# Patient Record
Sex: Female | Born: 2000 | Hispanic: Yes | Marital: Single
Health system: Southern US, Community
[De-identification: ages and names within clinical notes are randomized; demographics above are authoritative.]

---
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
## 2022-05-01 IMAGING — DX DG WRIST 2V*L*
2 series · 2 of 2 positions shown · non-contrast
Comparison: Pre splinting radiographs earlier today.

CLINICAL DATA: Fracture, post splint.

EXAM:
LEFT WRIST - 2 VIEW

[wrist ap]
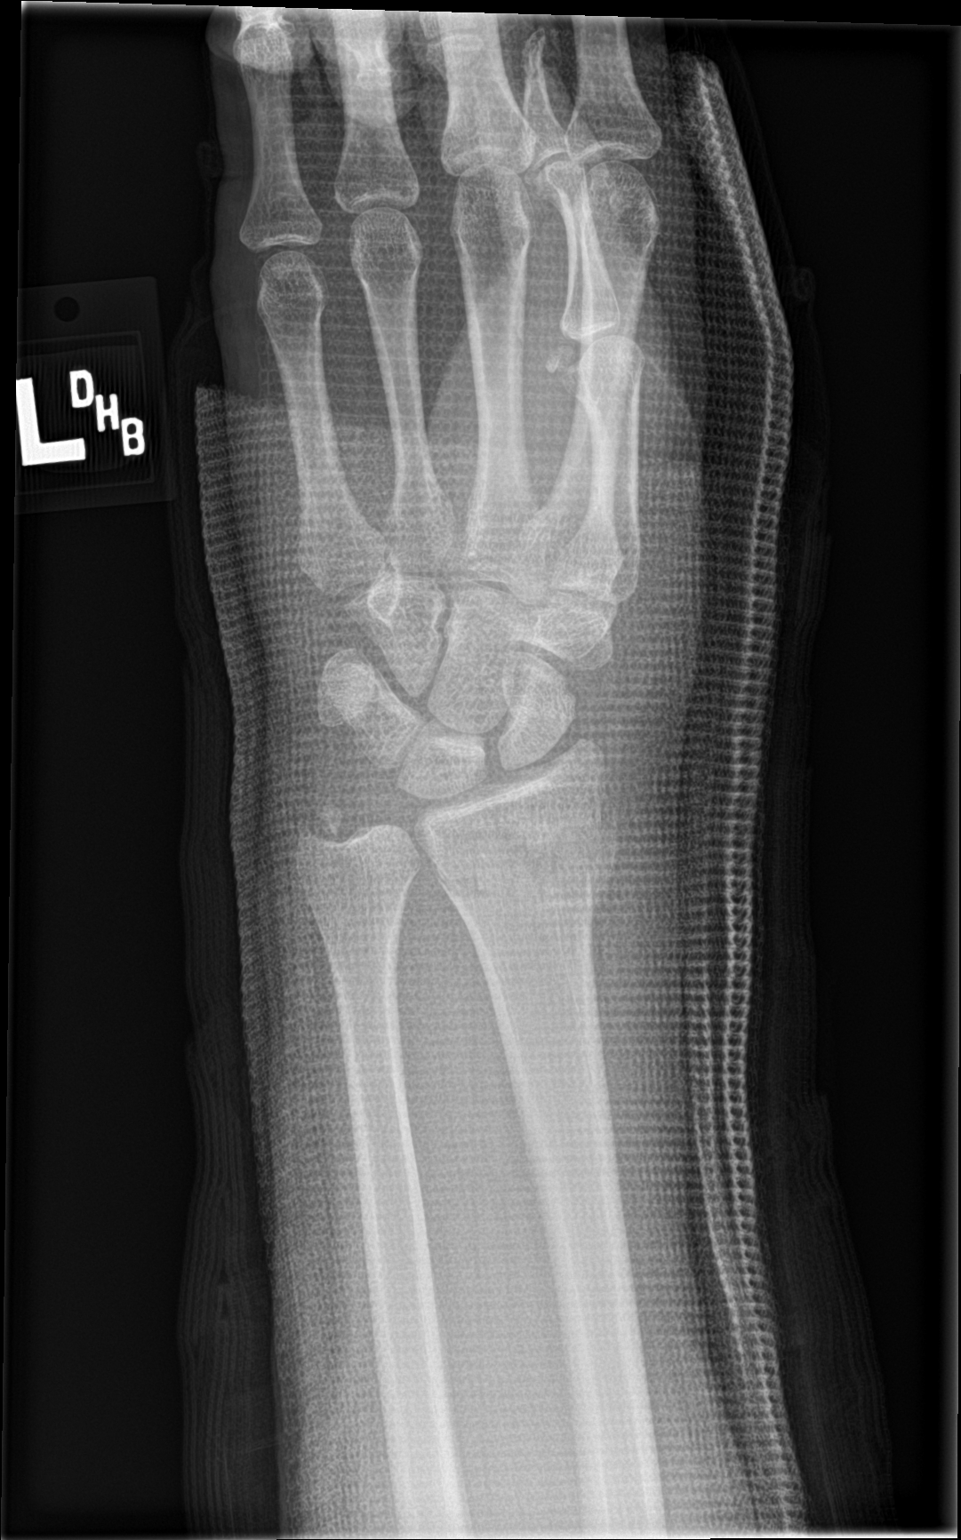

[wrist lat]
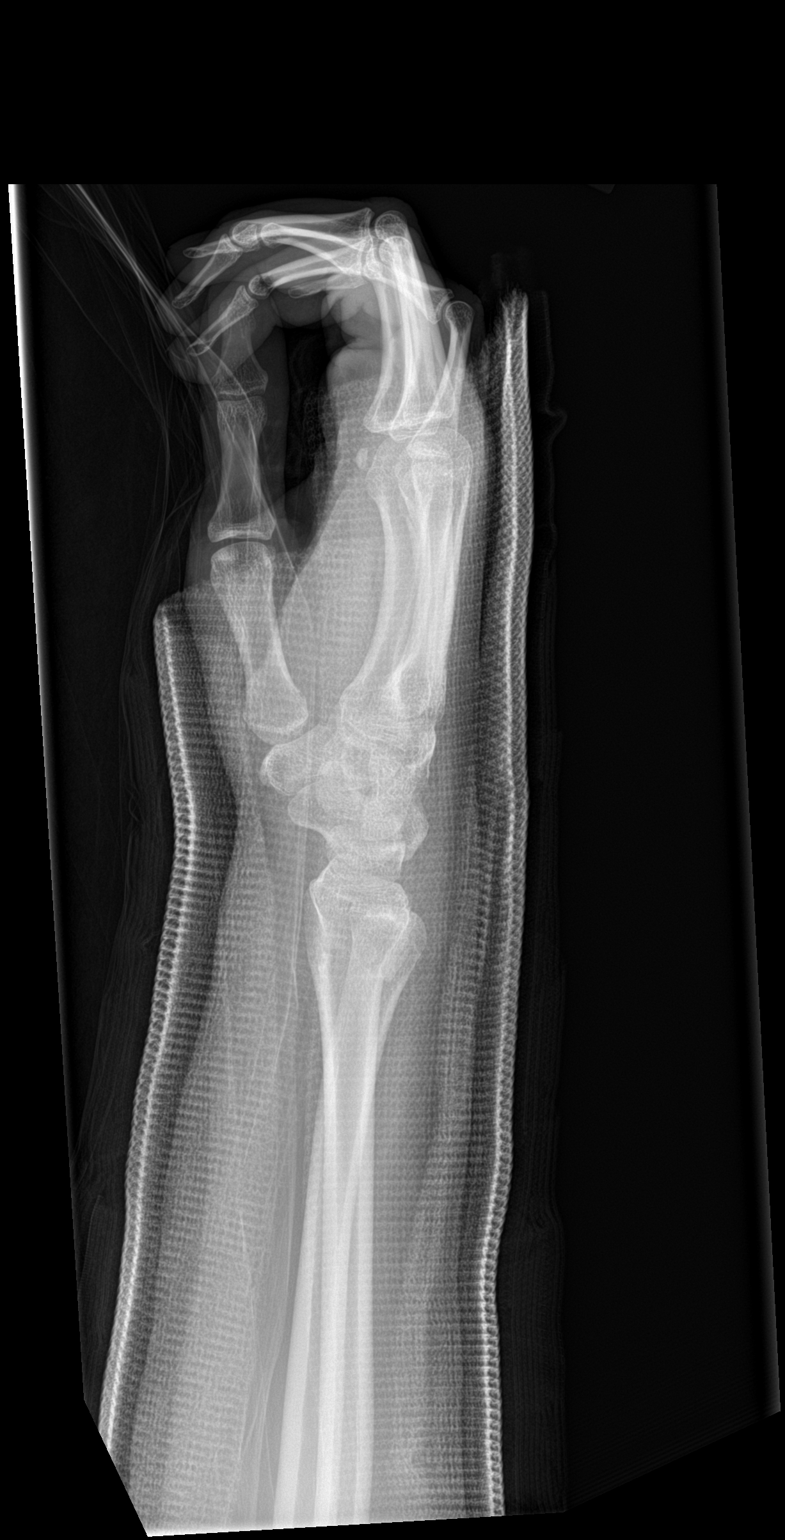

[2 of 2 positions shown; findings below may reference images not displayed]

FINDINGS: Overlying splint material in place. Improved alignment of distal
radius fracture post splinting. Mild residual displacement and
angulation. Unchanged alignment of the ulna styloid fracture. No new
fracture.
IMPRESSION: Improved alignment of distal radius fracture post splinting with
mild residual displacement and angulation. Unchanged ulna styloid
fracture.

## 2022-05-01 IMAGING — DX DG HAND COMPLETE 3+V*L*
2 series · 2 of 2 positions shown · non-contrast
Comparison: None.

CLINICAL DATA: Pain after fall on outstretched hand

EXAM:
LEFT HAND - COMPLETE 3+ VIEW

[hand ap]
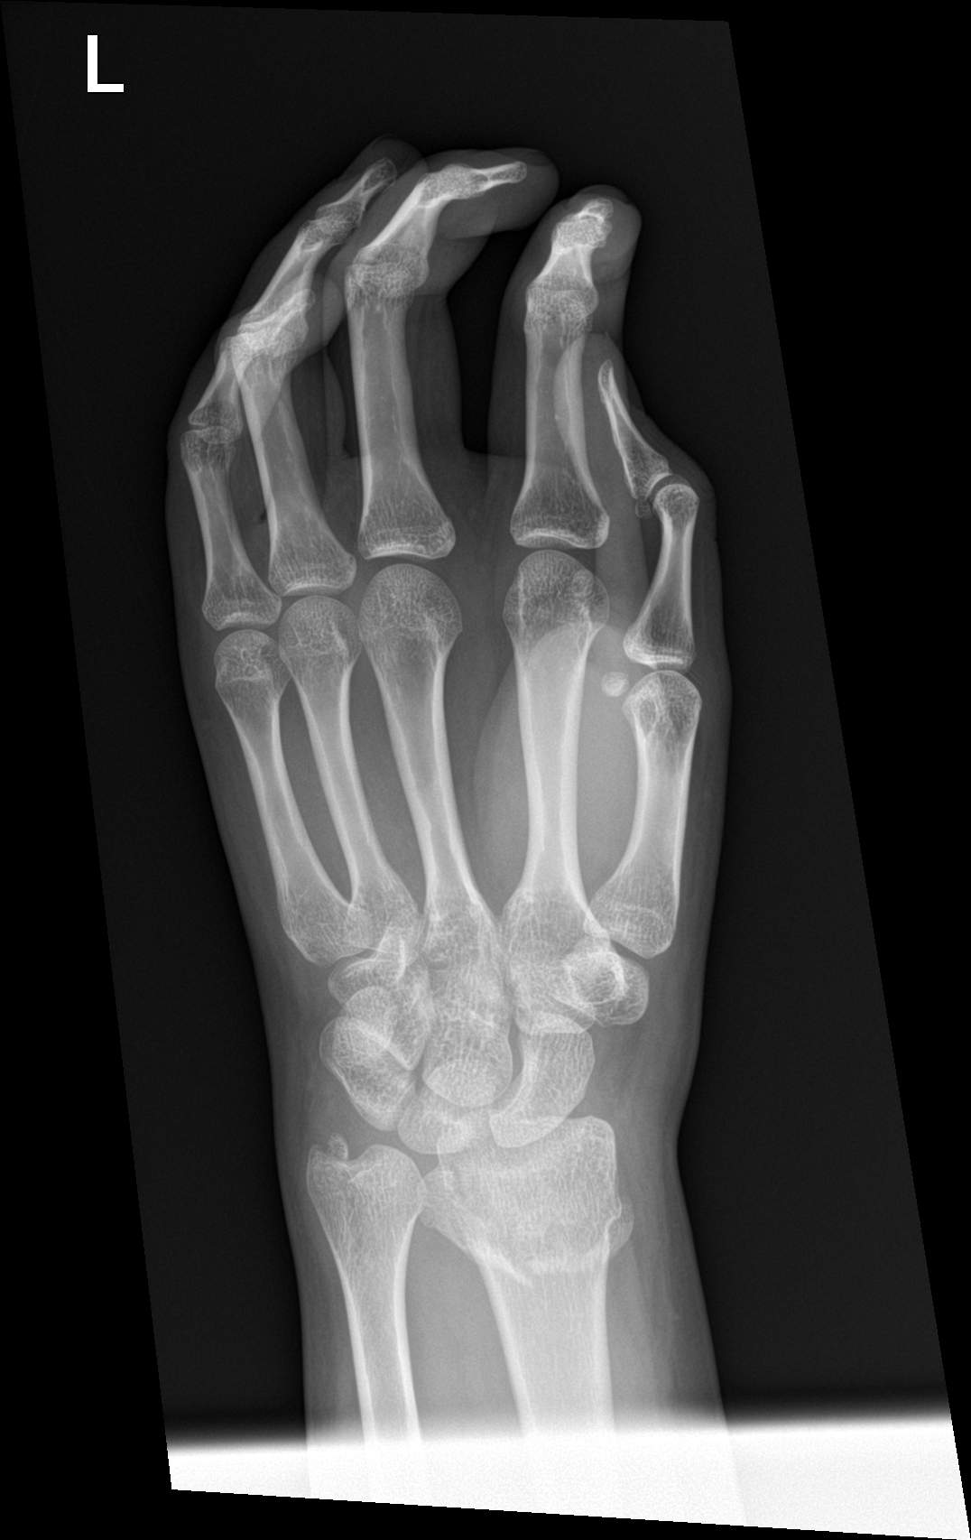

[hand obl]
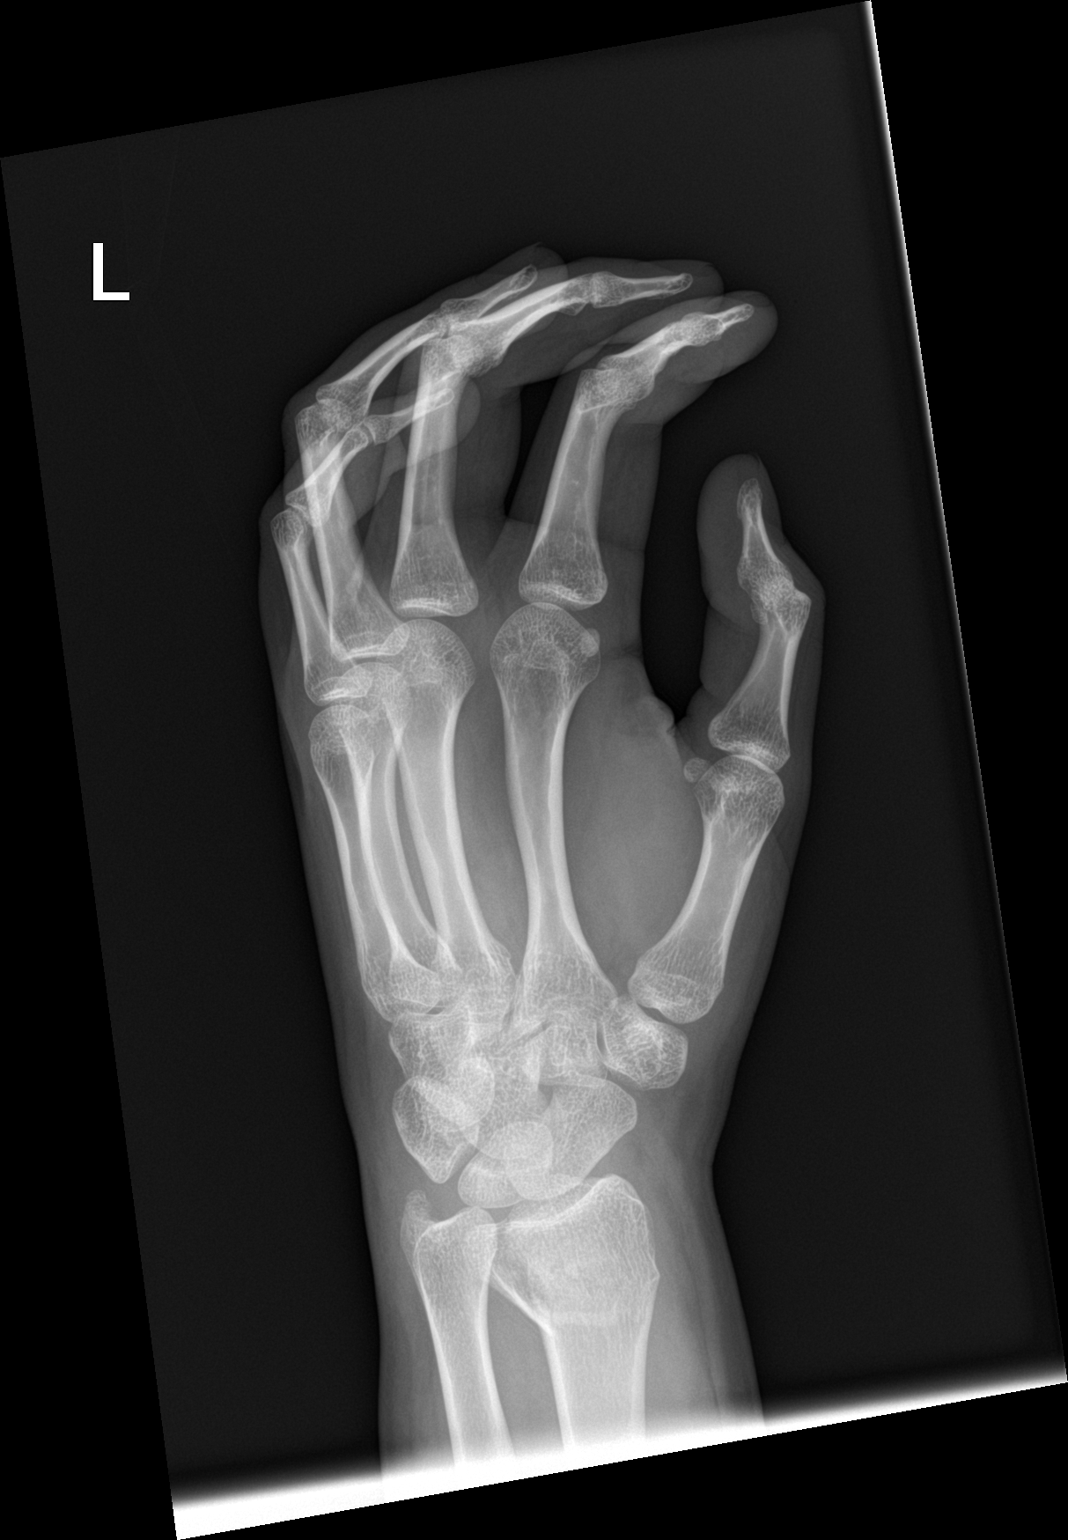

[2 of 2 positions shown; findings below may reference images not displayed]

FINDINGS: Minimally displaced fracture of the ulnar styloid process.
Comminuted impacted fracture of the distal radius with mild dorsal
angulation and extension to the radiocarpal joint.
IMPRESSION: Impacted comminuted distal radius fracture with mild dorsal
angulation and intra-articular extension.

Minimally displaced ulnar styloid fracture.

## 2022-05-01 IMAGING — DX DG WRIST COMPLETE 3+V*L*
3 series · 3 of 3 positions shown · non-contrast
Comparison: None.

CLINICAL DATA: Pain after fall on outstretched hand.

EXAM:
LEFT WRIST - COMPLETE 3+ VIEW

[wrist ap]
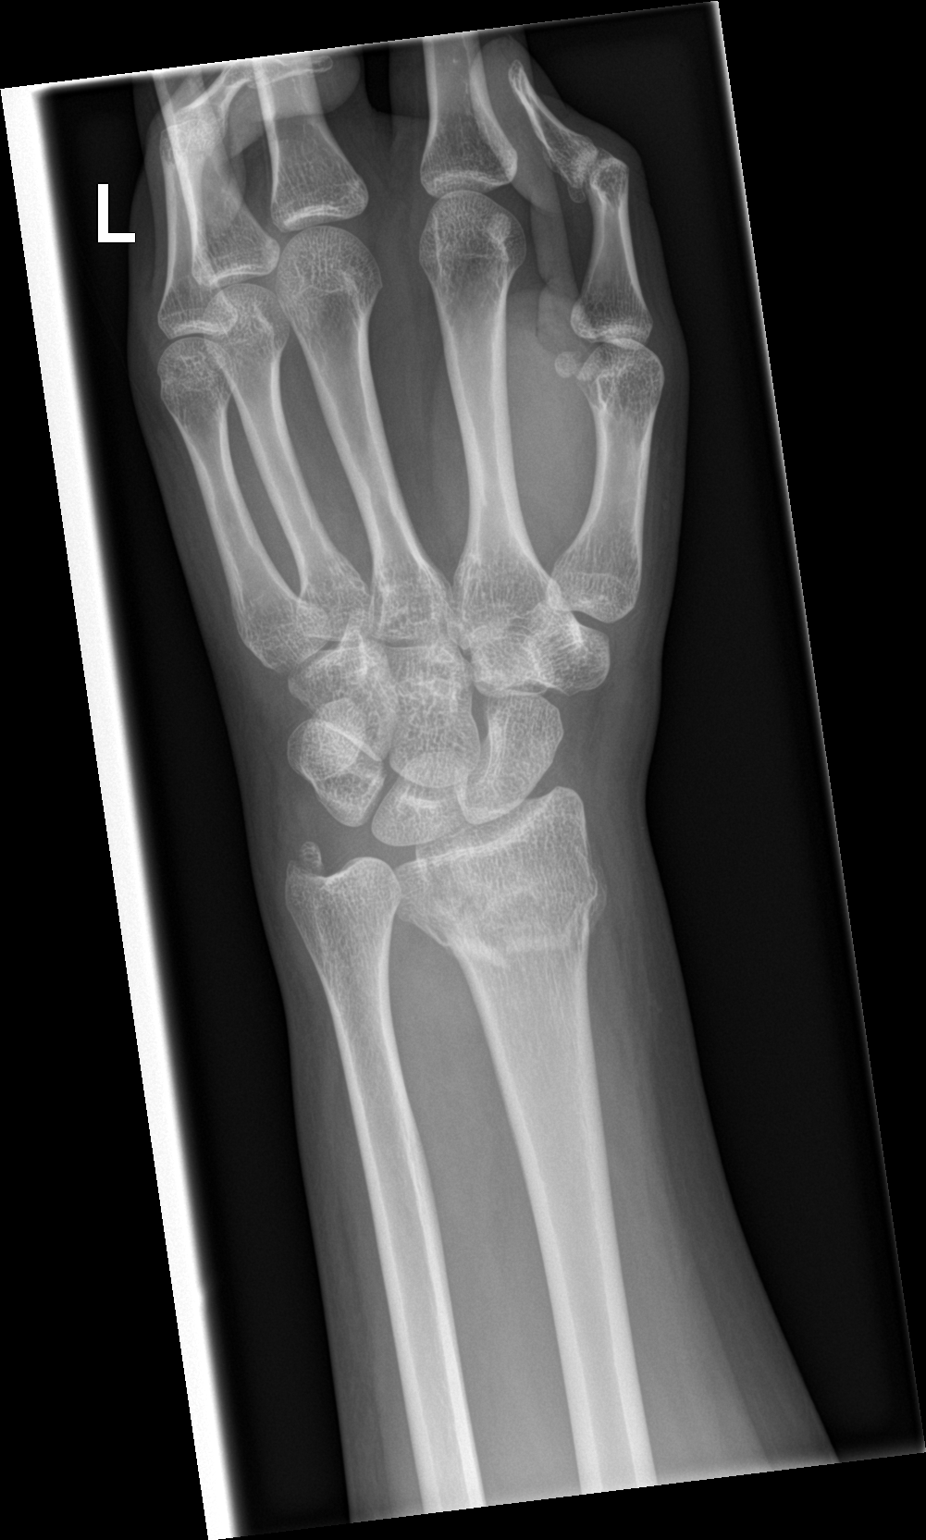

[wrist obl]
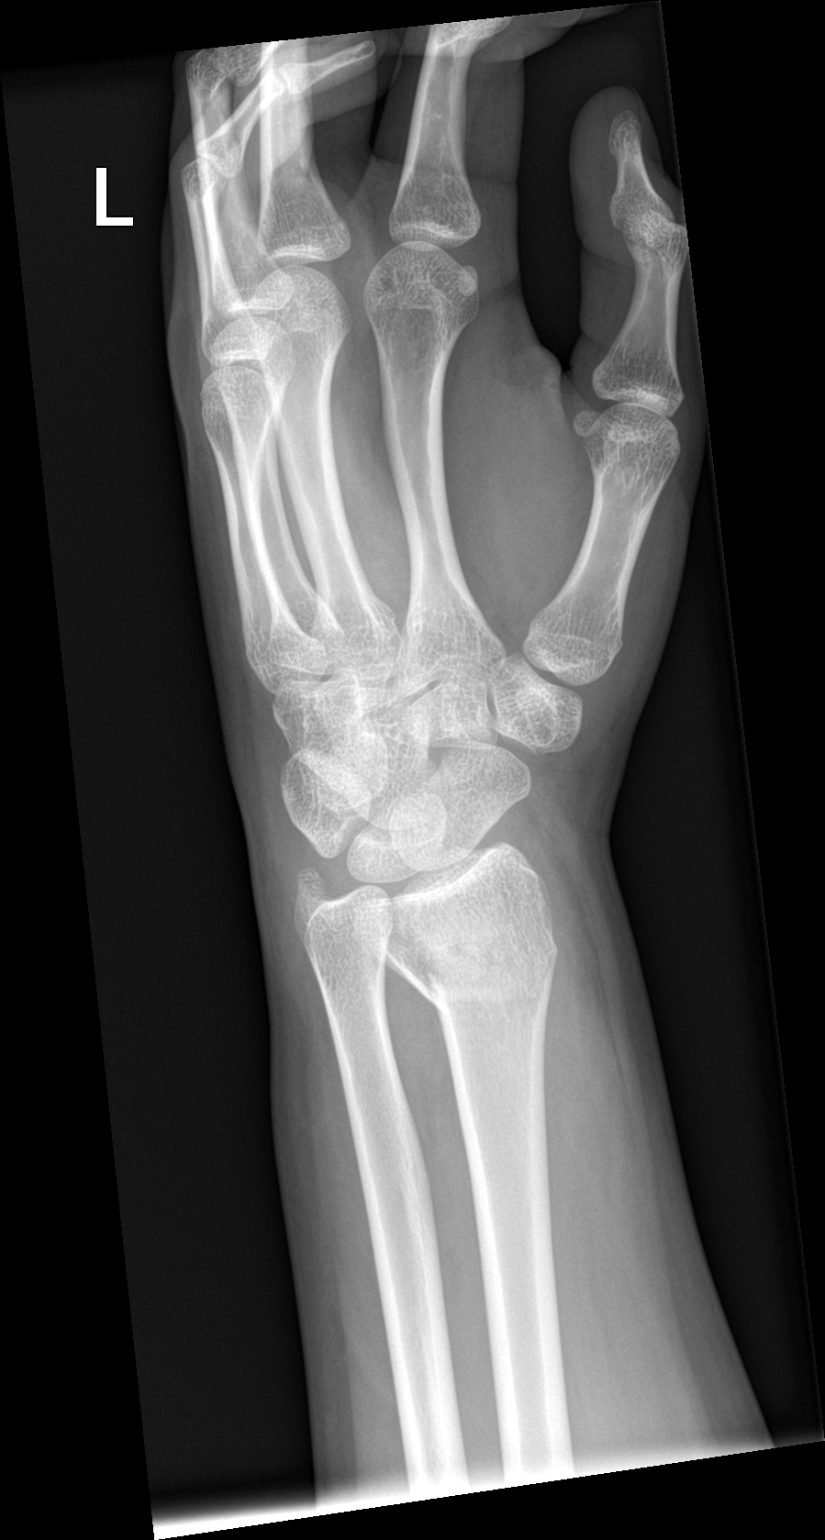

[wrist lat]
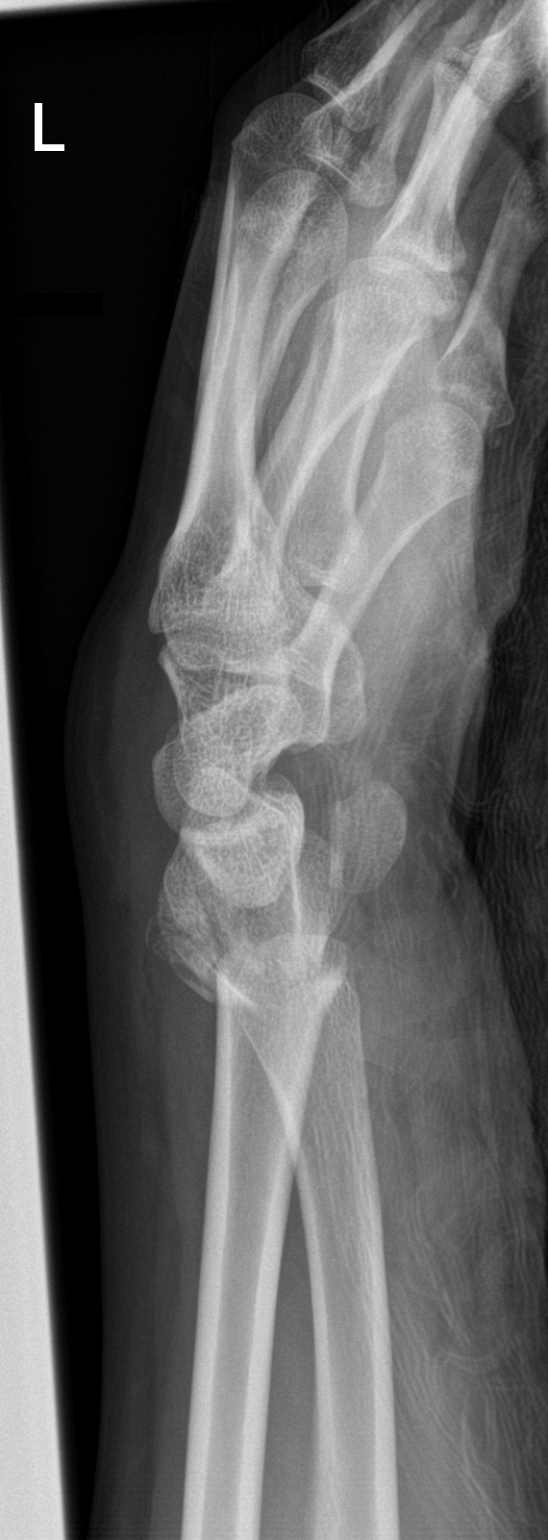

[3 of 3 positions shown; findings below may reference images not displayed]

FINDINGS: Impacted comminuted and dorsally angulated fracture of the distal
radius with extension to the radiocarpal joint. Minimally displaced
fracture of the ulnar styloid process.
IMPRESSION: Impacted comminuted distal radius fracture with mild dorsal
angulation and intra-articular extension.

Minimally displaced ulnar styloid fracture.
